# Patient Record
Sex: Female | Born: 1995 | Race: White | Hispanic: No | Marital: Single | Smoking: Never smoker
Health system: Southern US, Community
[De-identification: ages and names within clinical notes are randomized; demographics above are authoritative.]

---

## 2021-01-14 DIAGNOSIS — Z419 Encounter for procedure for purposes other than remedying health state, unspecified: Secondary | ICD-10-CM | POA: Diagnosis not present

## 2021-02-14 DIAGNOSIS — Z419 Encounter for procedure for purposes other than remedying health state, unspecified: Secondary | ICD-10-CM | POA: Diagnosis not present

## 2021-03-04 DIAGNOSIS — H5213 Myopia, bilateral: Secondary | ICD-10-CM | POA: Diagnosis not present

## 2021-03-16 DIAGNOSIS — Z419 Encounter for procedure for purposes other than remedying health state, unspecified: Secondary | ICD-10-CM | POA: Diagnosis not present

## 2021-04-06 ENCOUNTER — Ambulatory Visit
Admission: EM | Admit: 2021-04-06 | Discharge: 2021-04-06 | Disposition: A | Discharge status: Home / Self Care | Payer: Medicaid Other | Attending: Family Medicine | Admitting: Family Medicine

## 2021-04-06 ENCOUNTER — Other Ambulatory Visit: Payer: Self-pay

## 2021-04-06 ENCOUNTER — Ambulatory Visit (INDEPENDENT_AMBULATORY_CARE_PROVIDER_SITE_OTHER): Payer: Medicaid Other

## 2021-04-06 DIAGNOSIS — R059 Cough, unspecified: Secondary | ICD-10-CM

## 2021-04-06 DIAGNOSIS — R062 Wheezing: Secondary | ICD-10-CM

## 2021-04-06 DIAGNOSIS — R053 Chronic cough: Secondary | ICD-10-CM

## 2021-04-06 DIAGNOSIS — R42 Dizziness and giddiness: Secondary | ICD-10-CM

## 2021-04-06 MED ORDER — PREDNISONE 20 MG PO TABS
40.0000 mg | ORAL_TABLET | Freq: Every day | ORAL | 0 refills | Status: DC
Start: 2021-04-06 — End: 2021-04-22

## 2021-04-06 NOTE — Discharge Instructions (Addendum)
You have had labs (blood work) drawn today. We will call you with any significant abnormalities or if there is need to begin or change treatment or pursue further follow up.  You may also review your test results online through MyChart. If you do not have a MyChart account, instructions to sign up should be on your discharge paperwork.  

## 2021-04-06 NOTE — ED Triage Notes (Signed)
Pt states that she has had a cough x1 month.  Pt states that she has some dizziness, coughing, back pain with cough and clammy hands. X2-3 days Pt states that she is vaccinated.

## 2021-04-07 LAB — NOVEL CORONAVIRUS, NAA: SARS-CoV-2, NAA: NOT DETECTED

## 2021-04-07 LAB — SARS-COV-2, NAA 2 DAY TAT

## 2021-04-08 NOTE — ED Provider Notes (Signed)
[facility]   [id number] 04/06/21 Arrival Time: 1417  ASSESSMENT & PLAN:  1. Persistent cough for 3 weeks or longer   2. Cough, unspecified type   3. Episodic lightheadedness   4. Wheezing    Discussed typical duration of suspected viral illness. COVID-19 testing sent. OTC symptom care as needed.  Begin trial of: Meds ordered this encounter  Medications   predniSONE (DELTASONE) 20 MG tablet    Sig: Take 2 tablets (40 mg total) by mouth daily.    Dispense:  10 tablet    Refill:  0   Ensure adeq hydration.   Follow-up Information     [facility].   Specialty: Urgent Care Why: If worsening or failing to improve as anticipated. Contact information: [address] [place] [address] [phone]               For lightheaded feeling will check basic labs. Pending: CBC, CMP, TSH   Reviewed expectations re: course of current medical issues. Questions answered. Outlined signs and symptoms indicating need for more acute intervention. Understanding verbalized. After Visit Summary given.   SUBJECTIVE: History from: patient. Sheri Richardson is a 25 y.o. female who reports coughing for the past 3-4 weeks. Occas prod cough. Afebrile. Occasional "lightheaded feeling" after persistent coughing. No extremity sensation changes or weakness. No headaches or visual changes. Normal PO intake without n/v/d.  Social History   Tobacco Use  Smoking Status Never  Smokeless Tobacco Current    OBJECTIVE:  Vitals:   04/06/21 1431 04/06/21 1433  BP:  127/86  Pulse:  87  Temp:  98.4 F (36.9 C)  TempSrc:  Oral  SpO2:  96%  Weight: 68 kg   Height: 5\' 3"  (1.6 m)     General appearance: alert; no distress Eyes: PERRLA; EOMI; conjunctiva normal HENT: [place]; AT; with mild nasal congestion Neck: supple  Lungs: speaks full sentences without difficulty; unlabored Extremities: no edema Skin: warm and  dry Neurologic: normal gait Psychological: alert and cooperative; normal mood and affect  Labs:  Labs Reviewed  NOVEL CORONAVIRUS, NAA   Narrative:    Performed at:  [address], [place], [place]  [place] Lab Director: [id number] MD, Phone:  [phone]  SARS-COV-2, NAA 2 DAY TAT   Narrative:    Performed at:  [address] [place] [address], [place], [place]  [place] Lab Director: [id number] MD, Phone:  [phone]  CBC  COMPREHENSIVE METABOLIC PANEL  TSH    Imaging: DG Chest 2 View  Result Date: 04/06/2021 CLINICAL DATA:  Persistent cough for 1 month. EXAM: CHEST - 2 VIEW COMPARISON:  None. FINDINGS: The heart size and mediastinal contours are normal. The lungs are clear. There is no pleural effusion or pneumothorax. No acute osseous findings are identified. IMPRESSION: No active cardiopulmonary process. Electronically Signed   By: 04/08/2021 M.D.   On: 04/06/2021 14:53    No Known Allergies  History reviewed. No pertinent past medical history. Social History   Socioeconomic History   Marital status: Single    Spouse name: Not on file   Number of children: Not on file   Years of education: Not on file   Highest education level: Not on file  Occupational History   Not on file  Tobacco Use   Smoking status: Never   Smokeless tobacco: Current  Substance and Sexual Activity   Alcohol use: Not on file   Drug  use: Not on file   Sexual activity: Not on file  Other Topics Concern   Not on file  Social History Narrative   Not on file   Social Determinants of Health   Financial Resource Strain: Not on file  Food Insecurity: Not on file  Transportation Needs: Not on file  Physical Activity: Not on file  Stress: Not on file  Social Connections: Not on file  Intimate Partner Violence: Not on file   No family history on file. Past Surgical History:  Procedure Laterality Date   CESAREAN SECTION       Mardella Layman, MD 04/08/21 [phone]

## 2021-04-16 DIAGNOSIS — Z419 Encounter for procedure for purposes other than remedying health state, unspecified: Secondary | ICD-10-CM | POA: Diagnosis not present

## 2021-04-22 ENCOUNTER — Other Ambulatory Visit: Payer: Self-pay

## 2021-04-22 ENCOUNTER — Encounter: Payer: Self-pay | Admitting: Emergency Medicine

## 2021-04-22 ENCOUNTER — Ambulatory Visit
Admission: EM | Admit: 2021-04-22 | Discharge: 2021-04-22 | Disposition: A | Discharge status: Home / Self Care | Payer: Medicaid Other | Attending: Urgent Care | Admitting: Urgent Care

## 2021-04-22 DIAGNOSIS — J018 Other acute sinusitis: Secondary | ICD-10-CM | POA: Diagnosis not present

## 2021-04-22 DIAGNOSIS — R053 Chronic cough: Secondary | ICD-10-CM

## 2021-04-22 MED ORDER — PREDNISONE 20 MG PO TABS: 40.0000 mg | ORAL_TABLET | Freq: Every day | ORAL | 0 refills | Status: AC

## 2021-04-22 MED ORDER — AMOXICILLIN-POT CLAVULANATE 875-125 MG PO TABS: 1.0000 | ORAL_TABLET | Freq: Two times a day (BID) | ORAL | 0 refills | Status: AC

## 2021-04-22 MED ORDER — PSEUDOEPHEDRINE HCL 60 MG PO TABS: 60.0000 mg | ORAL_TABLET | Freq: Three times a day (TID) | ORAL | 0 refills | Status: AC | PRN

## 2021-04-22 MED ORDER — CETIRIZINE HCL 10 MG PO TABS: 10.0000 mg | ORAL_TABLET | Freq: Every day | ORAL | 0 refills | Status: AC

## 2021-04-22 NOTE — ED Provider Notes (Signed)
  [facility]   MRN: [id number] DOB: 1996-02-12  Subjective:   Sheri Richardson is a 25 y.o. female presenting for 2 to 3-week history of persistent cough, sinus drainage, sinus congestion. Was seen 04/06/2021, had negative COVID test. Rx for prednisone.  No chest pain, shortness of breath or wheezing.  No current facility-administered medications for this encounter.  Current Outpatient Medications:    predniSONE (DELTASONE) 20 MG tablet, Take 2 tablets (40 mg total) by mouth daily., Disp: 10 tablet, Rfl: 0   No Known Allergies  History reviewed. No pertinent past medical history.   Past Surgical History:  Procedure Laterality Date   CESAREAN SECTION      No family history on file.  Social History   Tobacco Use   Smoking status: Never   Smokeless tobacco: Current    ROS   Objective:   Vitals: BP 114/80 (BP Location: Right Arm)   Pulse 86   Temp 98.3 F (36.8 C) (Oral)   Resp 18   SpO2 96%   Physical Exam Constitutional:      General: She is not in acute distress.    Appearance: Normal appearance. She is well-developed. She is not ill-appearing, toxic-appearing or diaphoretic.  HENT:     Head: Normocephalic and atraumatic.     Nose: Nose normal.     Mouth/Throat:     Mouth: Mucous membranes are moist.  Eyes:     Extraocular Movements: Extraocular movements intact.     Pupils: Pupils are equal, round, and reactive to light.  Cardiovascular:     Rate and Rhythm: Normal rate and regular rhythm.     Pulses: Normal pulses.     Heart sounds: Normal heart sounds. No murmur heard.   No friction rub. No gallop.  Pulmonary:     Effort: Pulmonary effort is normal. No respiratory distress.     Breath sounds: Normal breath sounds. No stridor. No wheezing, rhonchi or rales.  Skin:    General: Skin is warm and dry.     Findings: No rash.  Neurological:     Mental Status: She is alert and oriented to person, place, and time.  Psychiatric:        Mood  and Affect: Mood normal.        Behavior: Behavior normal.        Thought Content: Thought content normal.    Assessment and Plan :   PDMP not reviewed this encounter.  1. Acute non-recurrent sinusitis of other sinus   2. Persistent cough    Will start empiric treatment for sinusitis with Augmentin.  Recommended supportive care otherwise including the use of oral antihistamine, decongestant.  Patient did request another round of steroids.  Recommended using pseudoephedrine after she is done with prednisone.  Deferred imaging given clear cardiopulmonary exam.  Counseled patient on potential for adverse effects with medications prescribed/recommended today, ER and return-to-clinic precautions discussed, patient verbalized understanding.    Wallis Bamberg, [place] 04/22/21 1830

## 2021-04-22 NOTE — ED Triage Notes (Signed)
PT reports she was here 10/22 and treated for cough. She took meds and cough returned. States cough never fully resolved, and has now gotten worse.

## 2021-04-24 LAB — COMPREHENSIVE METABOLIC PANEL

## 2021-04-24 LAB — CBC
Hematocrit: 48.5 % — ABNORMAL HIGH (ref 34.0–46.6)
Hemoglobin: 15 g/dL (ref 11.1–15.9)
MCH: 24.9 pg — ABNORMAL LOW (ref 26.6–33.0)
MCHC: 30.9 g/dL — ABNORMAL LOW (ref 31.5–35.7)
MCV: 81 fL (ref 79–97)
Platelets: 410 10*3/uL (ref 150–450)
RBC: 6.02 x10E6/uL — ABNORMAL HIGH (ref 3.77–5.28)
RDW: 12.8 % (ref 11.7–15.4)
WBC: 10.4 10*3/uL (ref 3.4–10.8)

## 2021-04-24 LAB — TSH

## 2021-05-16 DIAGNOSIS — Z419 Encounter for procedure for purposes other than remedying health state, unspecified: Secondary | ICD-10-CM | POA: Diagnosis not present

## 2021-06-16 DIAGNOSIS — Z419 Encounter for procedure for purposes other than remedying health state, unspecified: Secondary | ICD-10-CM | POA: Diagnosis not present

## 2021-07-17 DIAGNOSIS — Z419 Encounter for procedure for purposes other than remedying health state, unspecified: Secondary | ICD-10-CM | POA: Diagnosis not present

## 2021-08-13 DIAGNOSIS — H5213 Myopia, bilateral: Secondary | ICD-10-CM | POA: Diagnosis not present

## 2021-08-13 DIAGNOSIS — H52223 Regular astigmatism, bilateral: Secondary | ICD-10-CM | POA: Diagnosis not present

## 2021-08-14 DIAGNOSIS — Z419 Encounter for procedure for purposes other than remedying health state, unspecified: Secondary | ICD-10-CM | POA: Diagnosis not present

## 2021-09-14 DIAGNOSIS — Z419 Encounter for procedure for purposes other than remedying health state, unspecified: Secondary | ICD-10-CM | POA: Diagnosis not present

## 2021-10-14 DIAGNOSIS — Z419 Encounter for procedure for purposes other than remedying health state, unspecified: Secondary | ICD-10-CM | POA: Diagnosis not present

## 2021-11-14 DIAGNOSIS — Z419 Encounter for procedure for purposes other than remedying health state, unspecified: Secondary | ICD-10-CM | POA: Diagnosis not present

## 2021-12-14 DIAGNOSIS — Z419 Encounter for procedure for purposes other than remedying health state, unspecified: Secondary | ICD-10-CM | POA: Diagnosis not present

## 2022-01-14 DIAGNOSIS — Z419 Encounter for procedure for purposes other than remedying health state, unspecified: Secondary | ICD-10-CM | POA: Diagnosis not present

## 2022-02-14 DIAGNOSIS — Z419 Encounter for procedure for purposes other than remedying health state, unspecified: Secondary | ICD-10-CM | POA: Diagnosis not present

## 2022-03-16 DIAGNOSIS — Z419 Encounter for procedure for purposes other than remedying health state, unspecified: Secondary | ICD-10-CM | POA: Diagnosis not present

## 2022-04-16 DIAGNOSIS — Z419 Encounter for procedure for purposes other than remedying health state, unspecified: Secondary | ICD-10-CM | POA: Diagnosis not present

## 2022-05-16 DIAGNOSIS — Z419 Encounter for procedure for purposes other than remedying health state, unspecified: Secondary | ICD-10-CM | POA: Diagnosis not present

## 2022-06-16 DIAGNOSIS — Z419 Encounter for procedure for purposes other than remedying health state, unspecified: Secondary | ICD-10-CM | POA: Diagnosis not present

## 2022-07-17 DIAGNOSIS — Z419 Encounter for procedure for purposes other than remedying health state, unspecified: Secondary | ICD-10-CM | POA: Diagnosis not present

## 2022-08-15 DIAGNOSIS — Z419 Encounter for procedure for purposes other than remedying health state, unspecified: Secondary | ICD-10-CM | POA: Diagnosis not present

## 2023-03-23 IMAGING — DX DG CHEST 2V
2 series · 2 of 2 positions shown · non-contrast
Comparison: None.

CLINICAL DATA: Persistent cough for 1 month.

EXAM:
CHEST - 2 VIEW

[chest pa]
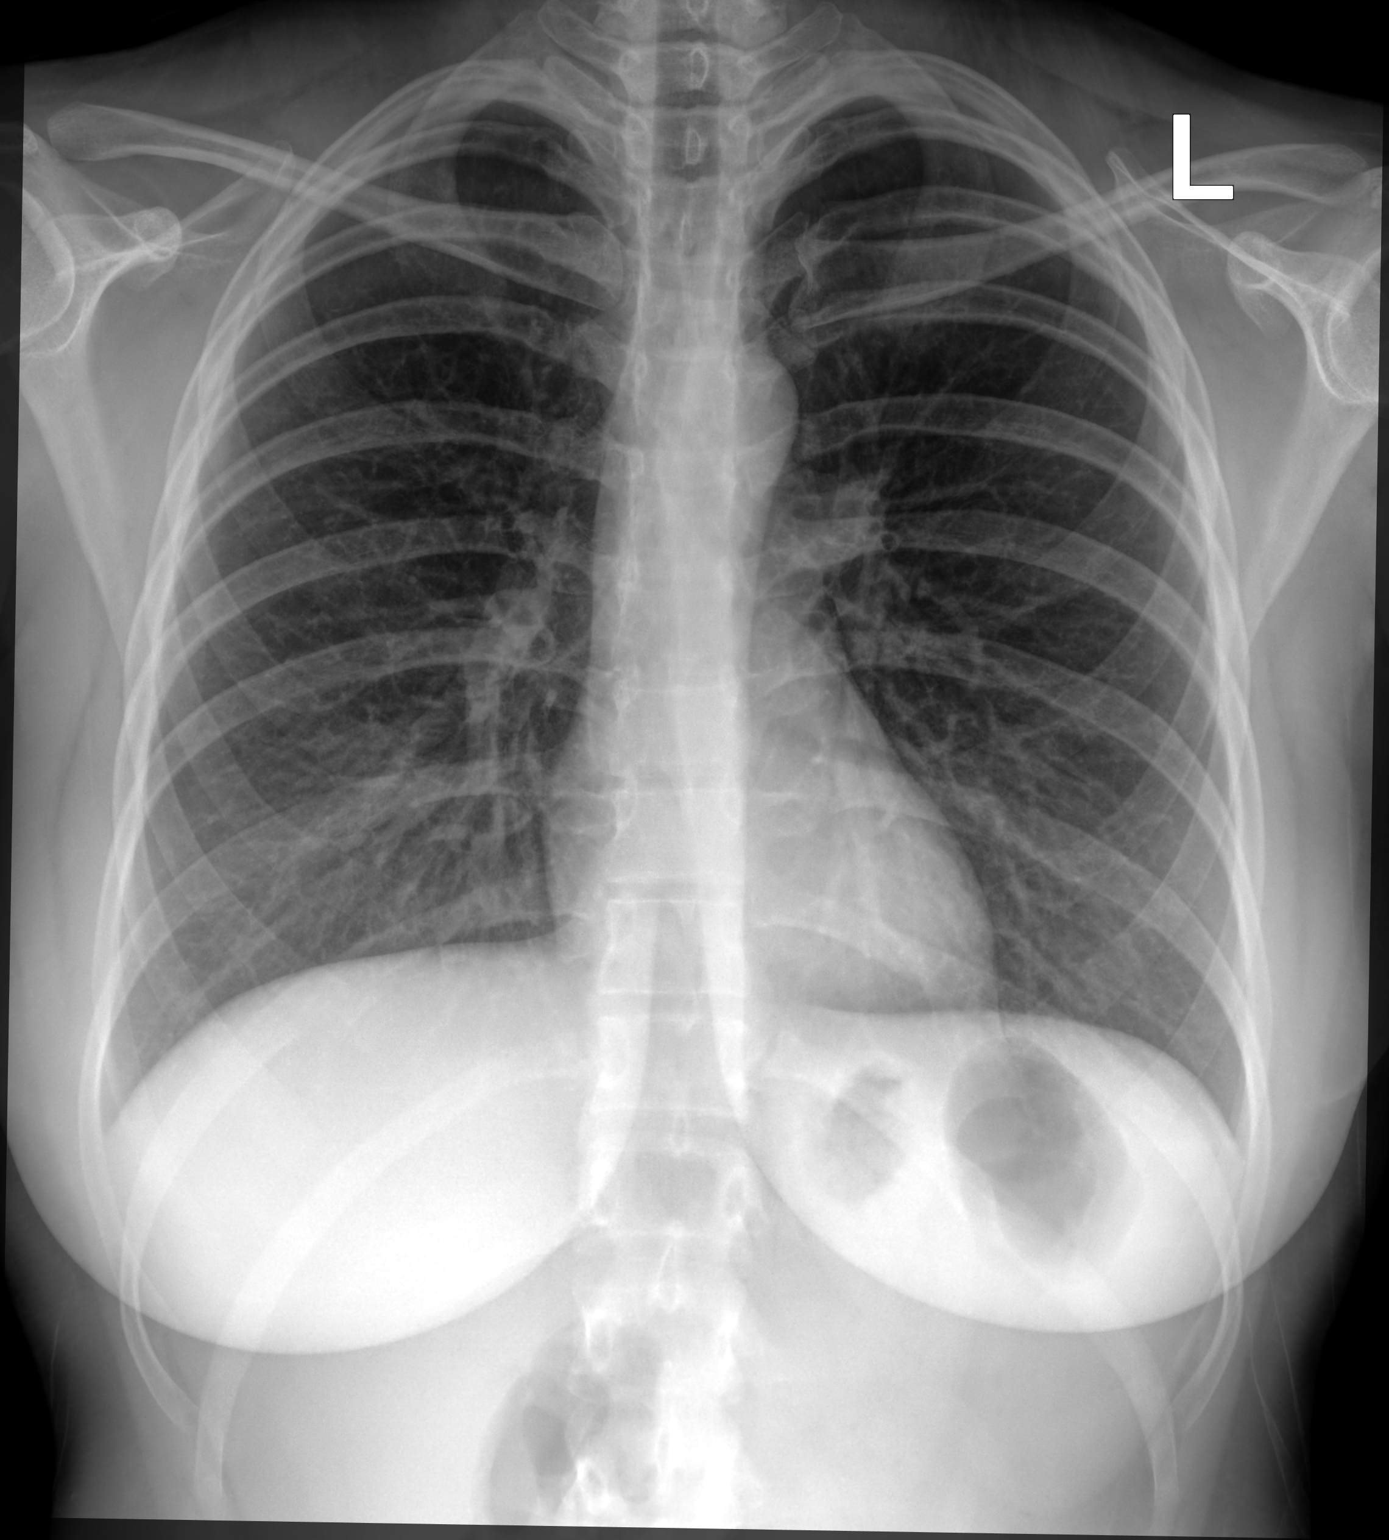

[chest lat]
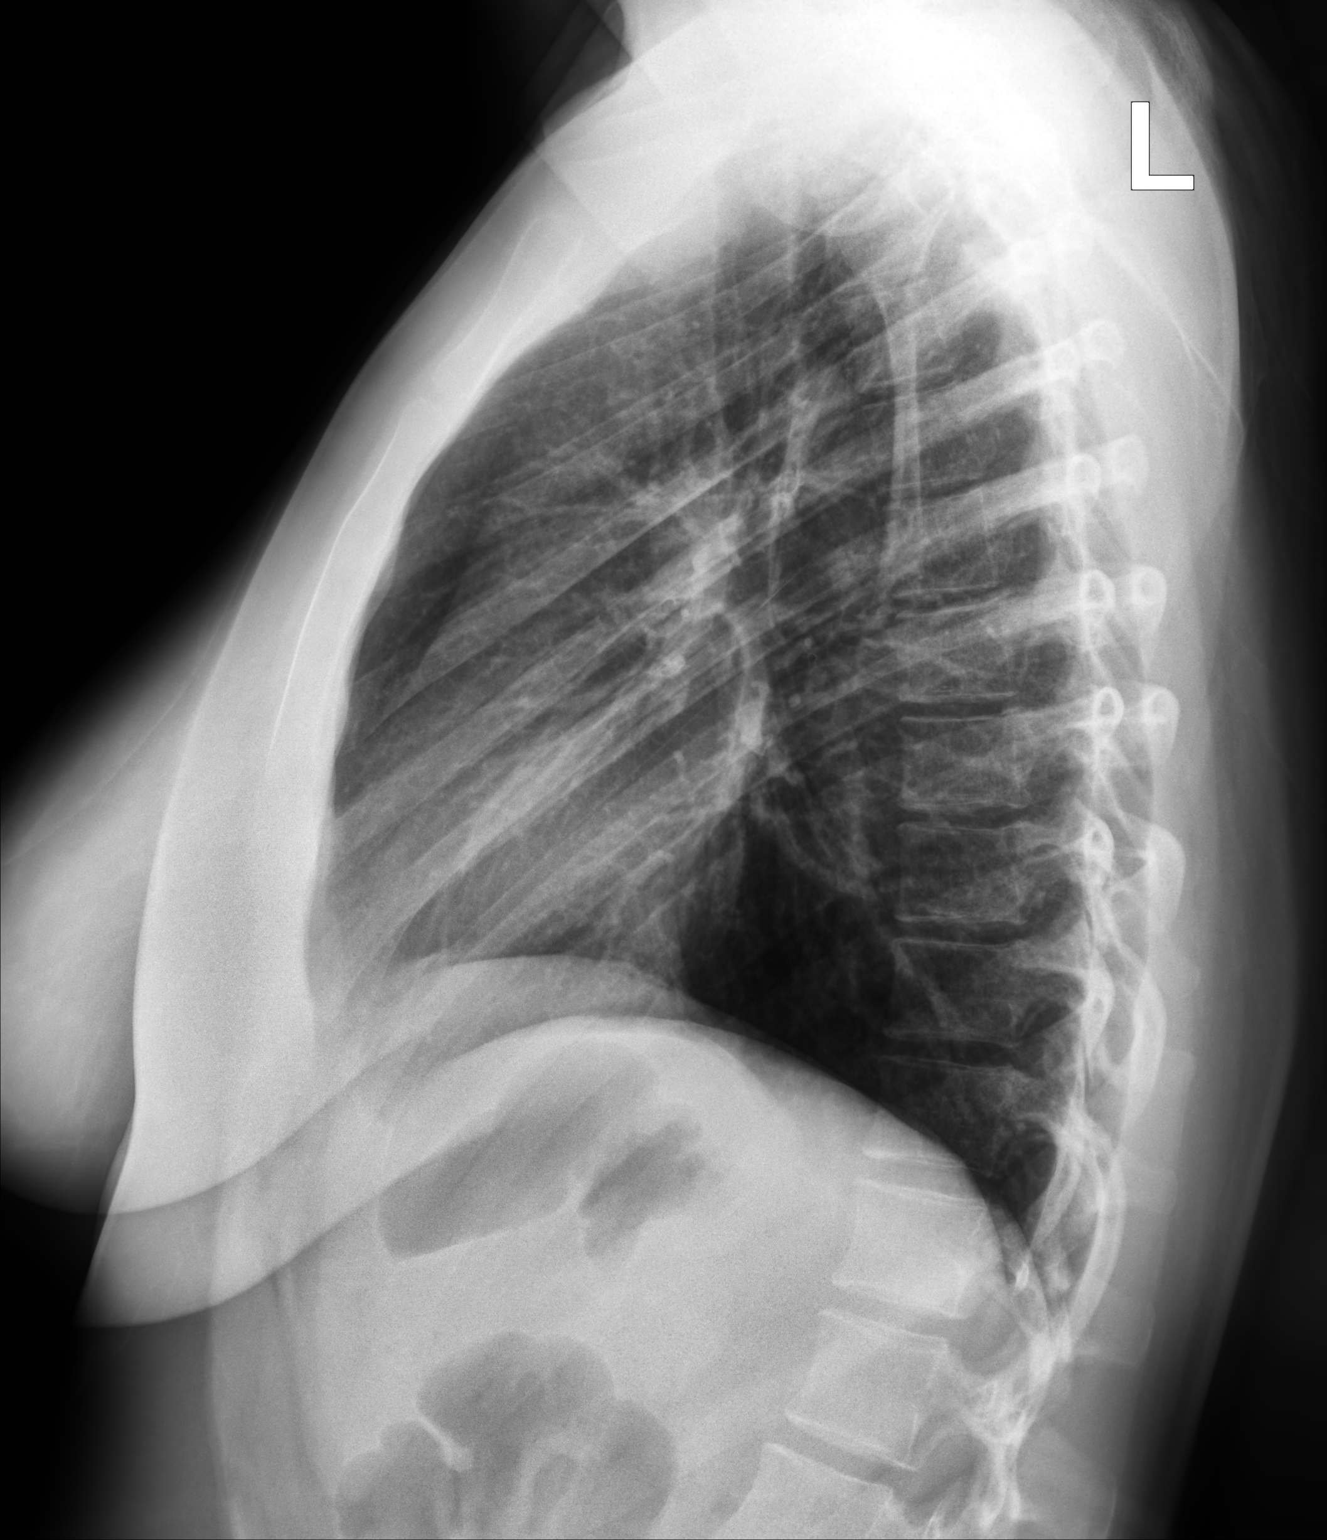

[2 of 2 positions shown; findings below may reference images not displayed]

FINDINGS: The heart size and mediastinal contours are normal. The lungs are
clear. There is no pleural effusion or pneumothorax. No acute
osseous findings are identified.
IMPRESSION: No active cardiopulmonary process.
